# Patient Record
Sex: Male | Born: 1965 | Race: White | Hispanic: No | Marital: Single | Smoking: Current every day smoker
Health system: Southern US, Community
[De-identification: ages and names within clinical notes are randomized; demographics above are authoritative.]

---

## 2015-01-25 ENCOUNTER — Ambulatory Visit: Payer: Medicaid Other | Admitting: Neurology

## 2015-02-04 ENCOUNTER — Ambulatory Visit: Payer: Self-pay | Admitting: Neurology

## 2015-02-04 ENCOUNTER — Telehealth: Payer: Self-pay | Admitting: Neurology

## 2015-02-04 ENCOUNTER — Encounter: Payer: Self-pay | Admitting: Neurology

## 2015-02-04 NOTE — Telephone Encounter (Signed)
This patient did not show for a new patient appointment today. 

## 2015-04-05 ENCOUNTER — Emergency Department (HOSPITAL_COMMUNITY)
Admission: EM | Admit: 2015-04-05 | Discharge: 2015-04-05 | Disposition: A | Discharge status: Home / Self Care | Payer: Self-pay | Attending: Emergency Medicine | Admitting: Emergency Medicine

## 2015-04-05 ENCOUNTER — Encounter (HOSPITAL_COMMUNITY): Payer: Self-pay | Admitting: Emergency Medicine

## 2015-04-05 DIAGNOSIS — K029 Dental caries, unspecified: Secondary | ICD-10-CM | POA: Insufficient documentation

## 2015-04-05 DIAGNOSIS — K047 Periapical abscess without sinus: Secondary | ICD-10-CM | POA: Insufficient documentation

## 2015-04-05 DIAGNOSIS — Z720 Tobacco use: Secondary | ICD-10-CM | POA: Insufficient documentation

## 2015-04-05 MED ORDER — PENICILLIN V POTASSIUM 500 MG PO TABS
500.0000 mg | ORAL_TABLET | Freq: Four times a day (QID) | ORAL | Status: DC
Start: 2015-04-05 — End: 2015-07-01

## 2015-04-05 MED ORDER — HYDROCODONE-ACETAMINOPHEN 5-325 MG PO TABS
1.0000 | ORAL_TABLET | Freq: Once | ORAL | Status: AC
  Administered 2015-04-05: 1 via ORAL
  Filled 2015-04-05: qty 1

## 2015-04-05 MED ORDER — HYDROCODONE-ACETAMINOPHEN 5-325 MG PO TABS: 1.0000 | ORAL_TABLET | Freq: Four times a day (QID) | ORAL | Status: AC | PRN

## 2015-04-05 MED ORDER — IBUPROFEN 800 MG PO TABS: 800.0000 mg | ORAL_TABLET | Freq: Three times a day (TID) | ORAL | Status: AC | PRN

## 2015-04-05 NOTE — ED Provider Notes (Signed)
CSN: [id number]     Arrival date & time 04/05/15  2014 History  This chart was scribed for non-physician practitioner Ebbie Ridgehris Abbie Berling, PA, working with Samuel JesterKathleen McManus, DO, by Tanda RockersMargaux Venter, ED Scribe. This patient was seen in room TR01C/TR01C and the patient's care was started at 9:11 PM.    Chief Complaint  Patient presents with  . Dental Pain   The history is provided by the patient. No language interpreter was used.     HPI Comments: Zachary Cobb is a 49 y.o. male who presents to the Emergency Department complaining of left upper tooth pain that began this morning. Pt reports that filling to left upper tooth fell out approximately 2 months ago. He has not seen a dentist since this occurred. Pt denies fever, chills, or any other symptoms.    History reviewed. No pertinent past medical history. History reviewed. No pertinent past surgical history. No family history on file. History  Substance Use Topics  . Smoking status: Current Every Day Smoker  . Smokeless tobacco: Not on file  . Alcohol Use: Yes    Review of Systems  A complete 10 system review of systems was obtained and all systems are negative except as noted in the HPI and PMH.    Allergies  Review of patient's allergies indicates no known allergies.  Home Medications   Prior to Admission medications   Medication Sig Start Date End Date Taking? Authorizing Provider  acetaminophen (TYLENOL) 500 MG tablet Take 500 mg by mouth every 6 (six) hours as needed for mild pain.   Yes Historical Provider, MD  Naproxen Sodium (ALEVE PO) Take 1 tablet by mouth daily as needed.   Yes Historical Provider, MD   Triage Vitals: BP 137/76 mmHg  Pulse 73  Temp(Src) 98.1 F (36.7 C) (Oral)  Resp 16  Ht 6' (1.829 m)  Wt 182 lb (82.555 kg)  BMI 24.68 kg/m2  SpO2 94%   Physical Exam  Constitutional: He is oriented to person, place, and time. He appears well-developed and well-nourished. No distress.  HENT:  Head:  Normocephalic and atraumatic.  Decaying to left lateral incisor with pain to gumline  Eyes: Conjunctivae and EOM are normal. Pupils are equal, round, and reactive to light.  Neck: Neck supple. No tracheal deviation present.  Cardiovascular: Normal rate, regular rhythm and normal heart sounds.   Pulmonary/Chest: Effort normal and breath sounds normal. No respiratory distress. He has no wheezes. He has no rales.  Musculoskeletal: Normal range of motion.  Neurological: He is alert and oriented to person, place, and time.  Skin: Skin is warm and dry.  Psychiatric: He has a normal mood and affect. His behavior is normal.  Nursing note and vitals reviewed.   ED Course  Procedures (including critical care time)  DIAGNOSTIC STUDIES: Oxygen Saturation is 94% on RA, normal by my interpretation.    COORDINATION OF CARE: 9:12 PM-Discussed treatment plan which includes antibiotic prescription with pt at bedside and pt agreed to plan.  The patient will be referred to dentistry.  Told to return here as needed.  Advised to use heat on the area    [facility] CharlestonChristopher Everrett Lacasse, PA-C 04/05/15 2125  Samuel JesterKathleen McManus, DO 04/07/15 1622

## 2015-04-05 NOTE — Discharge Instructions (Signed)
Return here as needed.  Follow-up with the dentist provided.  Use warm compresses over the area

## 2015-04-05 NOTE — ED Notes (Signed)
C/o dental pain since this morning.  Reports filling on L upper tooth fell out.

## 2015-05-04 ENCOUNTER — Encounter (HOSPITAL_COMMUNITY): Payer: Self-pay | Admitting: Emergency Medicine

## 2015-05-04 DIAGNOSIS — Z720 Tobacco use: Secondary | ICD-10-CM | POA: Insufficient documentation

## 2015-05-04 DIAGNOSIS — N508 Other specified disorders of male genital organs: Secondary | ICD-10-CM | POA: Insufficient documentation

## 2015-05-04 NOTE — ED Notes (Signed)
Patient here with complaint of left testicle pain and swelling accompanied by mild dysuria. Denies injury. Left testicle is much larger that right with some swelling above near pubic bone.

## 2015-05-05 ENCOUNTER — Emergency Department (HOSPITAL_COMMUNITY): Payer: Self-pay

## 2015-05-05 ENCOUNTER — Emergency Department (HOSPITAL_COMMUNITY)
Admission: EM | Admit: 2015-05-05 | Discharge: 2015-05-05 | Discharge status: Against Medical Advice | Payer: Self-pay | Attending: Internal Medicine | Admitting: Internal Medicine

## 2015-05-05 DIAGNOSIS — N50819 Testicular pain, unspecified: Secondary | ICD-10-CM

## 2015-05-05 NOTE — ED Notes (Signed)
No answer when called for the us

## 2015-05-05 NOTE — ED Notes (Signed)
Patient called for US, but didn't answer. At this time patient not present in waiting area.

## 2015-06-30 ENCOUNTER — Inpatient Hospital Stay (HOSPITAL_COMMUNITY): Payer: Self-pay

## 2015-06-30 ENCOUNTER — Encounter (HOSPITAL_COMMUNITY): Payer: Self-pay | Admitting: Emergency Medicine

## 2015-06-30 ENCOUNTER — Inpatient Hospital Stay (HOSPITAL_COMMUNITY)
Admission: EM | Admit: 2015-06-30 | Discharge: 2015-07-01 | DRG: 071 | Disposition: A | Discharge status: Home / Self Care | Payer: Self-pay | Attending: Internal Medicine | Admitting: Internal Medicine

## 2015-06-30 ENCOUNTER — Emergency Department (HOSPITAL_COMMUNITY): Payer: Self-pay

## 2015-06-30 DIAGNOSIS — Z791 Long term (current) use of non-steroidal anti-inflammatories (NSAID): Secondary | ICD-10-CM

## 2015-06-30 DIAGNOSIS — R4182 Altered mental status, unspecified: Secondary | ICD-10-CM

## 2015-06-30 DIAGNOSIS — N179 Acute kidney failure, unspecified: Secondary | ICD-10-CM | POA: Diagnosis present

## 2015-06-30 DIAGNOSIS — R410 Disorientation, unspecified: Secondary | ICD-10-CM | POA: Insufficient documentation

## 2015-06-30 DIAGNOSIS — G9340 Encephalopathy, unspecified: Principal | ICD-10-CM | POA: Diagnosis present

## 2015-06-30 DIAGNOSIS — M549 Dorsalgia, unspecified: Secondary | ICD-10-CM | POA: Diagnosis present

## 2015-06-30 DIAGNOSIS — Z79899 Other long term (current) drug therapy: Secondary | ICD-10-CM

## 2015-06-30 DIAGNOSIS — F17210 Nicotine dependence, cigarettes, uncomplicated: Secondary | ICD-10-CM | POA: Diagnosis present

## 2015-06-30 DIAGNOSIS — F14121 Cocaine abuse with intoxication with delirium: Secondary | ICD-10-CM

## 2015-06-30 DIAGNOSIS — Z79891 Long term (current) use of opiate analgesic: Secondary | ICD-10-CM

## 2015-06-30 DIAGNOSIS — F11121 Opioid abuse with intoxication delirium: Secondary | ICD-10-CM

## 2015-06-30 DIAGNOSIS — D7289 Other specified disorders of white blood cells: Secondary | ICD-10-CM | POA: Diagnosis present

## 2015-06-30 DIAGNOSIS — R740 Nonspecific elevation of levels of transaminase and lactic acid dehydrogenase [LDH]: Secondary | ICD-10-CM | POA: Diagnosis present

## 2015-06-30 DIAGNOSIS — F12121 Cannabis abuse with intoxication delirium: Secondary | ICD-10-CM

## 2015-06-30 DIAGNOSIS — G0490 Encephalitis and encephalomyelitis, unspecified: Secondary | ICD-10-CM | POA: Diagnosis present

## 2015-06-30 DIAGNOSIS — E872 Acidosis: Secondary | ICD-10-CM | POA: Diagnosis present

## 2015-06-30 DIAGNOSIS — F1910 Other psychoactive substance abuse, uncomplicated: Secondary | ICD-10-CM | POA: Diagnosis present

## 2015-06-30 LAB — COMPREHENSIVE METABOLIC PANEL
ALT: 176 U/L — ABNORMAL HIGH (ref 17–63)
AST: 107 U/L — ABNORMAL HIGH (ref 15–41)
Albumin: 4.1 g/dL (ref 3.5–5.0)
Alkaline Phosphatase: 71 U/L (ref 38–126)
Anion gap: 22 — ABNORMAL HIGH (ref 5–15)
BUN: 12 mg/dL (ref 6–20)
CO2: 16 mmol/L — ABNORMAL LOW (ref 22–32)
CREATININE: 1.25 mg/dL — AB (ref 0.61–1.24)
Calcium: 9.8 mg/dL (ref 8.9–10.3)
Chloride: 103 mmol/L (ref 101–111)
GFR calc Af Amer: >60 mL/min (ref >60)
GFR calc non Af Amer: >60 mL/min (ref >60)
GLUCOSE: 144 mg/dL — AB (ref 65–99)
POTASSIUM: 4.3 mmol/L (ref 3.5–5.1)
Sodium: 141 mmol/L (ref 135–145)
TOTAL PROTEIN: 8 g/dL (ref 6.5–8.1)
Total Bilirubin: 1.4 mg/dL — ABNORMAL HIGH (ref 0.3–1.2)

## 2015-06-30 LAB — CBC WITH DIFFERENTIAL/PLATELET
Basophils Absolute: 0 10*3/uL (ref 0.0–0.1)
Basophils Relative: 0 % (ref 0–1)
EOS ABS: 0.1 10*3/uL (ref 0.0–0.7)
Eosinophils Relative: 1 % (ref 0–5)
HEMATOCRIT: 46 % (ref 39.0–52.0)
Hemoglobin: 16.4 g/dL (ref 13.0–17.0)
Lymphocytes Relative: 6 % — ABNORMAL LOW (ref 12–46)
Lymphs Abs: 0.7 10*3/uL (ref 0.7–4.0)
MCH: 32.2 pg (ref 26.0–34.0)
MCHC: 35.7 g/dL (ref 30.0–36.0)
MCV: 90.4 fL (ref 78.0–100.0)
MONO ABS: 0.8 10*3/uL (ref 0.1–1.0)
MONOS PCT: 6 % (ref 3–12)
Neutro Abs: 10.6 10*3/uL — ABNORMAL HIGH (ref 1.7–7.7)
Neutrophils Relative %: 87 % — ABNORMAL HIGH (ref 43–77)
Platelets: 195 10*3/uL (ref 150–400)
RBC: 5.09 MIL/uL (ref 4.22–5.81)
RDW: 13.1 % (ref 11.5–15.5)
WBC: 12.2 10*3/uL — ABNORMAL HIGH (ref 4.0–10.5)

## 2015-06-30 LAB — URINALYSIS, ROUTINE W REFLEX MICROSCOPIC
Glucose, UA: NEGATIVE mg/dL
Hgb urine dipstick: NEGATIVE
KETONES UR: 15 mg/dL — AB
LEUKOCYTES UA: NEGATIVE
NITRITE: NEGATIVE
PROTEIN: 30 mg/dL — AB
Specific Gravity, Urine: 1.031 — ABNORMAL HIGH (ref 1.005–1.030)
Urobilinogen, UA: 0.2 mg/dL (ref 0.0–1.0)
pH: 5.5 (ref 5.0–8.0)

## 2015-06-30 LAB — CREATININE, SERUM
Creatinine, Ser: 1 mg/dL (ref 0.61–1.24)
GFR calc non Af Amer: >60 mL/min (ref >60)

## 2015-06-30 LAB — CBC
HCT: 46.4 % (ref 39.0–52.0)
HEMOGLOBIN: 15.9 g/dL (ref 13.0–17.0)
MCH: 31.3 pg (ref 26.0–34.0)
MCHC: 34.3 g/dL (ref 30.0–36.0)
MCV: 91.3 fL (ref 78.0–100.0)
PLATELETS: 191 10*3/uL (ref 150–400)
RBC: 5.08 MIL/uL (ref 4.22–5.81)
RDW: 13.3 % (ref 11.5–15.5)
WBC: 9.5 10*3/uL (ref 4.0–10.5)

## 2015-06-30 LAB — RAPID URINE DRUG SCREEN, HOSP PERFORMED
AMPHETAMINES: POSITIVE — AB
BARBITURATES: NOT DETECTED
Benzodiazepines: POSITIVE — AB
Cocaine: POSITIVE — AB
Opiates: POSITIVE — AB
TETRAHYDROCANNABINOL: POSITIVE — AB

## 2015-06-30 LAB — SALICYLATE LEVEL: Salicylate Lvl: <4 mg/dL (ref 2.8–30.0)

## 2015-06-30 LAB — LACTIC ACID, PLASMA
Lactic Acid, Venous: 0.8 mmol/L (ref 0.5–2.0)
Lactic Acid, Venous: 0.7 mmol/L (ref 0.5–2.0)

## 2015-06-30 LAB — PROTIME-INR
INR: 1.08 (ref 0.00–1.49)
Prothrombin Time: 14.2 seconds (ref 11.6–15.2)

## 2015-06-30 LAB — MRSA PCR SCREENING: MRSA by PCR: NEGATIVE

## 2015-06-30 LAB — URINE MICROSCOPIC-ADD ON

## 2015-06-30 LAB — APTT: APTT: 31 s (ref 24–37)

## 2015-06-30 LAB — ETHANOL: ALCOHOL ETHYL (B): 5 mg/dL — AB (ref <5)

## 2015-06-30 LAB — MAGNESIUM: Magnesium: 2.6 mg/dL — ABNORMAL HIGH (ref 1.7–2.4)

## 2015-06-30 LAB — ACETAMINOPHEN LEVEL

## 2015-06-30 LAB — PHOSPHORUS: Phosphorus: 5 mg/dL — ABNORMAL HIGH (ref 2.5–4.6)

## 2015-06-30 LAB — PROCALCITONIN: Procalcitonin: 0.36 ng/mL

## 2015-06-30 MED ORDER — LORAZEPAM 2 MG/ML IJ SOLN
INTRAMUSCULAR | Status: AC
  Filled 2015-06-30: qty 1

## 2015-06-30 MED ORDER — LORAZEPAM 2 MG/ML IJ SOLN
2.0000 mg | Freq: Once | INTRAMUSCULAR | Status: AC
  Administered 2015-06-30: 2 mg via INTRAMUSCULAR
  Filled 2015-06-30: qty 1

## 2015-06-30 MED ORDER — GADOBENATE DIMEGLUMINE 529 MG/ML IV SOLN: 18.0000 mL | Freq: Once | INTRAVENOUS | Status: AC | PRN

## 2015-06-30 MED ORDER — LORAZEPAM 2 MG/ML IJ SOLN
1.0000 mg | Freq: Once | INTRAMUSCULAR | Status: DC
  Filled 2015-06-30: qty 1

## 2015-06-30 MED ORDER — ZIPRASIDONE MESYLATE 20 MG IM SOLR
20.0000 mg | Freq: Once | INTRAMUSCULAR | Status: AC
  Administered 2015-06-30: 20 mg via INTRAMUSCULAR
  Filled 2015-06-30: qty 20

## 2015-06-30 MED ORDER — ONDANSETRON HCL 4 MG PO TABS
4.0000 mg | ORAL_TABLET | Freq: Four times a day (QID) | ORAL | Status: DC | PRN
Start: 2015-06-30 — End: 2015-07-01

## 2015-06-30 MED ORDER — SODIUM CHLORIDE 0.9 % IV SOLN
INTRAVENOUS | Status: DC
  Administered 2015-06-30 – 2015-07-01 (×2): via INTRAVENOUS

## 2015-06-30 MED ORDER — DEXTROSE 5 % IV SOLN
800.0000 mg | INTRAVENOUS | Status: AC
  Administered 2015-06-30: 800 mg via INTRAVENOUS
  Filled 2015-06-30: qty 16

## 2015-06-30 MED ORDER — ACYCLOVIR SODIUM 50 MG/ML IV SOLN
800.0000 mg | Freq: Three times a day (TID) | INTRAVENOUS | Status: DC
  Filled 2015-06-30 (×2): qty 16

## 2015-06-30 MED ORDER — DEXTROSE 5 % IV SOLN
800.0000 mg | Freq: Three times a day (TID) | INTRAVENOUS | Status: DC
  Filled 2015-06-30 (×2): qty 16

## 2015-06-30 MED ORDER — ENOXAPARIN SODIUM 40 MG/0.4ML ~~LOC~~ SOLN
40.0000 mg | SUBCUTANEOUS | Status: DC
  Administered 2015-06-30: 40 mg via SUBCUTANEOUS
  Filled 2015-06-30 (×2): qty 0.4

## 2015-06-30 MED ORDER — ONDANSETRON HCL 4 MG/2ML IJ SOLN: 4.0000 mg | Freq: Four times a day (QID) | INTRAMUSCULAR | Status: DC | PRN

## 2015-06-30 MED ORDER — LORAZEPAM 2 MG/ML IJ SOLN
2.0000 mg | INTRAMUSCULAR | Status: DC | PRN
  Administered 2015-06-30 (×2): 2 mg via INTRAVENOUS
  Filled 2015-06-30 (×2): qty 1

## 2015-06-30 MED ORDER — STERILE WATER FOR INJECTION IJ SOLN
INTRAMUSCULAR | Status: AC
  Administered 2015-06-30: 1.2 mL
  Filled 2015-06-30: qty 10

## 2015-06-30 MED ORDER — NICOTINE 21 MG/24HR TD PT24
21.0000 mg | MEDICATED_PATCH | Freq: Every day | TRANSDERMAL | Status: DC
  Administered 2015-06-30 – 2015-07-01 (×2): 21 mg via TRANSDERMAL
  Filled 2015-06-30 (×2): qty 1
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
## 2015-06-30 NOTE — ED Notes (Signed)
Dr Criss Alvine at bedside to perform LP.
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
## 2015-06-30 NOTE — ED Notes (Signed)
Dr. Camillo at bedside. 

## 2015-06-30 NOTE — Progress Notes (Signed)
ANTIBIOTIC CONSULT NOTE - INITIAL  Pharmacy Consult for acyclovir Indication: R/O herpes encephalitis  No Known Allergies  Patient Measurements:   Ideal Body Weight: 78kg  Vital Signs: Temp: 98.1 F (36.7 C) (07/31 1004) Temp Source: Rectal (07/31 1004) BP: 108/67 mmHg (07/31 1000) Pulse Rate: 95 (07/31 1000) Intake/Output from previous day:   Intake/Output from this shift: Total I/O In: -  Out: 425 [Urine:425]  Labs:  Recent Labs  06/30/15 0810  CREATININE 1.25*   CrCl cannot be calculated (Unknown ideal weight.). No results for input(s): VANCOTROUGH, VANCOPEAK, VANCORANDOM, GENTTROUGH, GENTPEAK, GENTRANDOM, TOBRATROUGH, TOBRAPEAK, TOBRARND, AMIKACINPEAK, AMIKACINTROU, AMIKACIN in the last 72 hours.   Microbiology: No results found for this or any previous visit (from the past 720 hour(s)).  Medical History: History reviewed. No pertinent past medical history.  Medications:  Med history pending Assessment: 49 year old man with acute delerium to start on acyclovir for possible herpes encephalitis.  Ideal body weight is 78kg   Plan:  Follow up culture results Acyclovir  IV q8h  Follow renal function  Mickeal Skinner 06/30/2015,10:26 AM

## 2015-06-30 NOTE — Consult Note (Signed)
    [facility]     Reason for Consult: encephalopathy    Referring Physician: Dr. Izola Price    . acyclovir  800 mg Intravenous Q8H  . enoxaparin (LOVENOX) injection  40 mg Subcutaneous Q24H    Recommendations: D/c droplet isolation  d/c acyclovir Drug rehab  Assessment: He has drug induced delirium.  MRI negative for infection.  Patient alert.    Thanks for consult.  Will sign off.   Antibiotics: Acyclovir.   HPI: Zachary Cobb is a 49 y.o. male with history of substance abuse and endorses cocaine, heroin and marijuana use regularly who was found running in traffic by police with erratic behavior.  AMS when he arrived.  Last thing he remembers was yesterday.  Tachy.  Afebrile.  CT initially concern for encephalitis but MRI negative.  No fever, no sick contacts.     Review of Systems: A comprehensive review of systems was negative.  No diarrhea, no dysuria  History reviewed. No pertinent past medical history.   History  Substance Use Topics  . Smoking status: Current Every Day Smoker  . Smokeless tobacco: Not on file  . Alcohol Use: Yes  drug abuse  FMHx: no hx CVA in family  No Known Allergies  OBJECTIVE: Blood pressure 118/74, pulse 69, temperature 98.1 F (36.7 C), temperature source Rectal, resp. rate 19, height  (1.854 m), weight 160 lb 0.9 oz (72.6 kg), SpO2 99 %. General: awake, alert, nad HEENT: anicteric Skin: no rashes Lungs: CTA B Cor: RRR without m Abdomen: soft, nt, nd Exxt: no edema Neuro: moves all extremities, oriented to place and time  Microbiology: No results found for this or any previous visit (from the past 240 hour(s)).  Staci Righter, MD [facility] [email or web address] [phone] pager  [phone] cell 06/30/2015, 3:25 PM

## 2015-06-30 NOTE — ED Notes (Addendum)
Placed pt on 2 L nasal cannula.  Pt sleeping due to medications.  Pt diaphoresis has slowed but not stopped.
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
## 2015-06-30 NOTE — ED Notes (Signed)
Dr Criss Alvine unable to obtain CSF cultures.
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
## 2015-06-30 NOTE — ED Notes (Signed)
On arrival to the IP unit, pt woke up.  Alert to location and year, pt reported confusion.  Pt admitted to taking several things; reported one was heroine and looked down at his left arm.
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
## 2015-07-01 DIAGNOSIS — G0490 Encephalitis and encephalomyelitis, unspecified: Secondary | ICD-10-CM

## 2015-07-01 DIAGNOSIS — R740 Nonspecific elevation of levels of transaminase and lactic acid dehydrogenase [LDH]: Secondary | ICD-10-CM

## 2015-07-01 DIAGNOSIS — N179 Acute kidney failure, unspecified: Secondary | ICD-10-CM

## 2015-07-01 DIAGNOSIS — F1910 Other psychoactive substance abuse, uncomplicated: Secondary | ICD-10-CM

## 2015-07-01 DIAGNOSIS — R410 Disorientation, unspecified: Secondary | ICD-10-CM

## 2015-07-01 LAB — COMPREHENSIVE METABOLIC PANEL
ALBUMIN: 3.3 g/dL — AB (ref 3.5–5.0)
ALT: 122 U/L — AB (ref 17–63)
AST: 62 U/L — AB (ref 15–41)
Alkaline Phosphatase: 52 U/L (ref 38–126)
Anion gap: 5 (ref 5–15)
BUN: 14 mg/dL (ref 6–20)
CALCIUM: 8.7 mg/dL — AB (ref 8.9–10.3)
CO2: 28 mmol/L (ref 22–32)
Chloride: 108 mmol/L (ref 101–111)
Creatinine, Ser: 0.96 mg/dL (ref 0.61–1.24)
GFR calc Af Amer: >60 mL/min (ref >60)
GFR calc non Af Amer: >60 mL/min (ref >60)
Glucose, Bld: 105 mg/dL — ABNORMAL HIGH (ref 65–99)
Potassium: 3.4 mmol/L — ABNORMAL LOW (ref 3.5–5.1)
Sodium: 141 mmol/L (ref 135–145)
TOTAL PROTEIN: 6.4 g/dL — AB (ref 6.5–8.1)
Total Bilirubin: 0.3 mg/dL (ref 0.3–1.2)

## 2015-07-01 LAB — CBC
HCT: 41.5 % (ref 39.0–52.0)
HEMOGLOBIN: 14.2 g/dL (ref 13.0–17.0)
MCH: 31.5 pg (ref 26.0–34.0)
MCHC: 34.2 g/dL (ref 30.0–36.0)
MCV: 92 fL (ref 78.0–100.0)
PLATELETS: 185 10*3/uL (ref 150–400)
RBC: 4.51 MIL/uL (ref 4.22–5.81)
RDW: 13.5 % (ref 11.5–15.5)
WBC: 7.8 10*3/uL (ref 4.0–10.5)

## 2015-07-01 LAB — HEPATITIS PANEL, ACUTE
HCV Ab: >11 s/co ratio — ABNORMAL HIGH (ref 0.0–0.9)
Hep A IgM: NEGATIVE
Hep B C IgM: NEGATIVE
Hepatitis B Surface Ag: NEGATIVE

## 2015-07-01 LAB — HIV ANTIBODY (ROUTINE TESTING W REFLEX): HIV Screen 4th Generation wRfx: NONREACTIVE

## 2015-07-01 LAB — RPR: RPR Ser Ql: NONREACTIVE

## 2015-07-01 MED ORDER — NICOTINE 21 MG/24HR TD PT24: 21.0000 mg | MEDICATED_PATCH | Freq: Every day | TRANSDERMAL | Status: AC

## 2015-07-01 NOTE — Progress Notes (Signed)
Pt refusing PRN ativan for CIWAH score. Pt states he does not want anything that might make him sleepy, states he want to keep a clear mind. States he does not feel like he needs to be in the hospital and wants to go home. Calling various phone numbers looking for a ride to get home, states he will let me know later if he decides to stay or go

## 2015-07-01 NOTE — Progress Notes (Signed)
   07/01/15 1000  Clinical Encounter Type  Visited With Patient;Other (Comment) (girlfriend)  Visit Type Follow-up;Spiritual support;Social support  Referral From Nurse  Consult/Referral To Chaplain  Spiritual Encounters  Spiritual Needs Prayer;Emotional  Stress Factors  Patient Stress Factors Other (Comment) (drug use history)  Ch met with pt on referral; offered prayer; talked about near death drug overdose; pt waiting discharge; girlfriend in room clearly showing signs of being on some type of stimulant with jerky bodily movement and moderate lucidity; pt demeanor non-reactionary and calm.

## 2015-07-01 NOTE — Progress Notes (Signed)
Subjective: Resting comfortably. Feels mental status has improved. Main concern this morning is back pain.   Objective: Current vital signs: BP 111/74 mmHg  Pulse 78  Temp(Src) 98 F (36.7 C) (Oral)  Resp 13  Ht 6\' 1"  (1.854 m)  Wt 72.6 kg (160 lb 0.9 oz)  BMI 21.12 kg/m2  SpO2 97% Vital signs in last 24 hours: Temp:  [97.3 F (36.3 C)-98.8 F (37.1 C)] 98 F (36.7 C) (08/01 0335) Pulse Rate:  [69-160] 78 (08/01 0727) Resp:  [13-28] 13 (08/01 0727) BP: (86-139)/(51-80) 111/74 mmHg (08/01 0727) SpO2:  [89 %-100 %] 97 % (08/01 0727) Weight:  [72.6 kg (160 lb 0.9 oz)] 72.6 kg (160 lb 0.9 oz) (07/31 1307)  Intake/Output from previous day: 07/31 0701 - 08/01 0700 In: 1340 [P.O.:640; I.V.:700] Out: 425 [Urine:425] Intake/Output this shift:   Nutritional status: Diet regular Room service appropriate?: Yes; Fluid consistency:: Thin  Neurologic Exam: Mental status: alert, responsive, oriented x 3. Follows simple commands. No dysarthria, no aphasia noted. CN 2-12: pupils 2-3 mm, reactive. No gaze preference. Face symmetric.  Motor: moves all limbs symmetrically. Sensory: Light touch intact in all extremities DTR's: 1+ all over. Plantars: down.  Lab Results: Basic Metabolic Panel:  Recent Labs Lab 06/30/15 0810 06/30/15 1450 07/01/15 0244  NA 141  --  141  K 4.3  --  3.4*  CL 103  --  108  CO2 16*  --  28  GLUCOSE 144*  --  105*  BUN 12  --  14  CREATININE 1.25* 1.00 0.96  CALCIUM 9.8  --  8.7*  MG  --  2.6*  --   PHOS  --  5.0*  --     Liver Function Tests:  Recent Labs Lab 06/30/15 0810 07/01/15 0244  AST 107* 62*  ALT 176* 122*  ALKPHOS 71 52  BILITOT 1.4* 0.3  PROT 8.0 6.4*  ALBUMIN 4.1 3.3*   No results for input(s): LIPASE, AMYLASE in the last 168 hours. No results for input(s): AMMONIA in the last 168 hours.  CBC:  Recent Labs Lab 06/30/15 0944 06/30/15 1450 07/01/15 0244  WBC 12.2* 9.5 7.8  NEUTROABS 10.6*  --   --   HGB 16.4 15.9  14.2  HCT 46.0 46.4 41.5  MCV 90.4 91.3 92.0  PLT 195 191 185    Cardiac Enzymes: No results for input(s): CKTOTAL, CKMB, CKMBINDEX, TROPONINI in the last 168 hours.  Lipid Panel: No results for input(s): CHOL, TRIG, HDL, CHOLHDL, VLDL, LDLCALC in the last 168 hours.  CBG: No results for input(s): GLUCAP in the last 168 hours.  Microbiology: Results for orders placed or performed during the hospital encounter of 06/30/15  MRSA PCR Screening     Status: None   Collection Time: 06/30/15  2:03 PM  Result Value Ref Range Status   MRSA by PCR NEGATIVE NEGATIVE Final    Comment:        The GeneXpert MRSA Assay (FDA approved for NASAL specimens only), is one component of a comprehensive MRSA colonization surveillance program. It is not intended to diagnose MRSA infection nor to guide or monitor treatment for MRSA infections.     Coagulation Studies:  Recent Labs  06/30/15 1450  LABPROT 14.2  INR 1.08    Imaging: Ct Head Wo Contrast  06/30/2015   CLINICAL DATA:  Altered mental status. Found running in traffic by police.  EXAM: CT HEAD WITHOUT CONTRAST  TECHNIQUE: Contiguous axial images were obtained from the base  of the skull through the vertex without intravenous contrast.  COMPARISON:  None.  FINDINGS: There is blurring of the gray-white junction throughout the left temporal lobe (representative images 10 through 12, series 2). This finding is without associated mass effect. No definitive intraparenchymal or extra-axial mass or hemorrhage. Normal size and configuration of the ventricles and basilar cisterns. No midline shift. Limited visualization of the paranasal sinuses and mastoid air cells is normal. No air-fluid levels. Regional soft tissues appear normal. No displaced calvarial fracture.  IMPRESSION: Blurring of the gray-white junction throughout the left temporal lobe, potentially artifactual secondary to streak from the adjacent calvarium though could be seen in the  setting herpes encephalitis (favored) versus evolving infarct. Further evaluation with brain MRI could be performed as clinically indicated.   Electronically Signed   By: Simonne Come M.D.   On: 06/30/2015 09:20   Mr Laqueta Jean ZO Contrast  06/30/2015   CLINICAL DATA:  Altered mental status. Erratic behavior. Abnormal CT of the head.  EXAM: MRI HEAD WITHOUT AND WITH CONTRAST  TECHNIQUE: Multiplanar, multiecho pulse sequences of the brain and surrounding structures were obtained without and with intravenous contrast.  CONTRAST:  18 mL MultiHance  COMPARISON:  None.  FINDINGS: The temporal lobes are symmetric and normal in signal. There is no edema or enhancement to suggest encephalitis or infection.  No acute infarct, hemorrhage, or mass lesion is present. Mild periventricular T2 changes are slightly advanced for age. The ventricles are of normal size. No significant extraaxial fluid collection is present.  Flow is present in the major intracranial arteries. The globes orbits are intact. Mild mucosal thickening is noted in the anterior ethmoid air cells bilaterally. The remaining paranasal sinuses are clear. There is minimal fluid in the mastoid air cells. No obstructing nasopharyngeal lesion is evident.  The postcontrast images demonstrate no pathologic enhancement.  Skullbase is within normal limits. Midline structures are unremarkable.  IMPRESSION: 1. Normal MRI appearance of the brain. No evidence for infection or encephalitis. 2. CT findings were likely artifactual, which could not be determined on the basis of the CT.   Electronically Signed   By: Marin Roberts M.D.   On: 06/30/2015 12:54   Dg Chest Port 1 View  06/30/2015   CLINICAL DATA:  Altered mental status.  EXAM: PORTABLE CHEST - 1 VIEW  COMPARISON:  None.  FINDINGS: The cardiomediastinal silhouette is unremarkable.  Minimal right basilar atelectasis noted.  There is no evidence of focal airspace disease, pulmonary edema, suspicious pulmonary  nodule/mass, pleural effusion, or pneumothorax. No acute bony abnormalities are identified.  IMPRESSION: Minimal right basilar atelectasis.   Electronically Signed   By: Harmon Pier M.D.   On: 06/30/2015 11:53    Medications:  Scheduled: . enoxaparin (LOVENOX) injection  40 mg Subcutaneous Q24H  . nicotine  21 mg Transdermal Daily    Assessment/Plan:  49 y/o presenting with hyperactive delirium likely drug induced in the setting of UDS positive for cocaine, amphetamines, THC, benzo and opiates. Mental status markedly improved this morning. Afebrile with no leukocytosis.  -no indication for EEG or further neurological workup at this time -will follow up as needed   LOS: 1 day   Elspeth Cho, DO [facility] [phone]  If 7pm- 7am, please page neurology on call as listed in AMION. 07/01/2015  7:55 AM

## 2015-07-01 NOTE — Care Management Note (Signed)
Case Management Note  Patient Details  Name: [name] MRN: [id number] Date of Birth: [birth year]  Subjective/Objective:                 Admitted with acute encephalopathy / sepsis   Action/Plan:  Return to home when medically stable. CM to f/u with d/c needs. Expected Discharge Date:                  Expected Discharge Plan:  Home/Self Care  In-House Referral:  Clinical Social Work (pt with drug abuse hx)  Discharge planning Services  CM Consult  Post Acute Care Choice:    Choice offered to:     DME Arranged:    DME Agency:     HH Arranged:    HH Agency:     Status of Service:  In process, will continue to follow  Medicare Important Message Given:    Date Medicare IM Given:    Medicare IM give by:    Date Additional Medicare IM Given:    Additional Medicare Important Message give by:     If discussed at Long Length of Stay Meetings, dates discussed:    Additional Comments: [place]  [phone]  Epifanio Lesches, RN 07/01/2015, 10:39 AM
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
## 2015-07-01 NOTE — Progress Notes (Signed)
Discharge instructions given to patient all questions answered at this time.  Pt. VSS with no s/s of distress noted.  Pt. Stable at discharge.  Cab voucher given to patient.

## 2015-07-01 NOTE — Discharge Instructions (Signed)
Altered Mental Status °Altered mental status most often refers to an abnormal change in your responsiveness and awareness. It can affect your speech, thought, mobility, memory, attention span, or alertness. It can range from slight confusion to complete unresponsiveness (coma). Altered mental status can be a sign of a serious underlying medical condition. Rapid evaluation and medical treatment is necessary for patients having an altered mental status. °CAUSES  °· Low blood sugar (hypoglycemia) or diabetes. °· Severe loss of body fluids (dehydration) or a body salt (electrolyte) imbalance. °· A stroke or other neurologic problem, such as dementia or delirium. °· A head injury or tumor. °· A drug or alcohol overdose. °· Exposure to toxins or poisons. °· Depression, anxiety, and stress. °· A low oxygen level (hypoxia). °· An infection. °· Blood loss. °· Twitching or shaking (seizure). °· Heart problems, such as heart attack or heart rhythm problems (arrhythmias). °· A body temperature that is too low or too high (hypothermia or hyperthermia). °DIAGNOSIS  °A diagnosis is based on your history, symptoms, physical and neurologic examinations, and diagnostic tests. Diagnostic tests may include: °· Measurement of your blood pressure, pulse, breathing, and oxygen levels (vital signs). °· Blood tests. °· Urine tests. °· X-ray exams. °· A computerized magnetic scan (magnetic resonance imaging, MRI). °· A computerized X-ray scan (computed tomography, CT scan). °TREATMENT  °Treatment will depend on the cause. Treatment may include: °· Management of an underlying medical or mental health condition. °· Critical care or support in the hospital. °HOME CARE INSTRUCTIONS  °· Only take over-the-counter or prescription medicines for pain, discomfort, or fever as directed by your caregiver. °· Manage underlying conditions as directed by your caregiver. °· Eat a healthy, well-balanced diet to maintain strength. °· Join a support group or  prevention program to cope with the condition or trauma that caused the altered mental status. Ask your caregiver to help choose a program that works for you. °· Follow up with your caregiver for further examination, therapy, or testing as directed. °SEEK MEDICAL CARE IF:  °· You feel unwell or have chills. °· You or your family notice a change in your behavior or your alertness. °· You have trouble following your caregiver's treatment plan. °· You have questions or concerns. °SEEK IMMEDIATE MEDICAL CARE IF:  °· You have a rapid heartbeat or have chest pain. °· You have difficulty breathing. °· You have a fever. °· You have a headache with a stiff neck. °· You cough up blood. °· You have blood in your urine or stool. °· You have severe agitation or confusion. °MAKE SURE YOU:  °· Understand these instructions. °· Will watch your condition. °· Will get help right away if you are not doing well or get worse. °Document Released: 05/06/2010 Document Revised: 02/08/2012 Document Reviewed: 05/06/2010 °ExitCare® Patient Information ©2015 [employer]. This information is not intended to replace advice given to you by your health care provider. Make sure you discuss any questions you have with your health care provider. ° ° °Polysubstance Abuse °When people abuse more than one drug or type of drug it is called polysubstance or polydrug abuse. For example, many smokers also drink alcohol. This is one form of polydrug abuse. Polydrug abuse also refers to the use of a drug to counteract an unpleasant effect produced by another drug. It may also be used to help with withdrawal from another drug. People who take stimulants may become agitated. Sometimes this agitation is countered with a tranquilizer. This helps protect against the   unpleasant side effects. Polydrug abuse also refers to the use of different drugs at the same time.  °Anytime drug use is interfering with normal living activities, it has become abuse. This includes  problems with family and friends. Psychological dependence has developed when your mind tells you that the drug is needed. This is usually followed by physical dependence which has developed when continuing increases of drug are required to get the same feeling or "high". This is known as addiction or chemical dependency. A person's risk is much higher if there is a history of chemical dependency in the family. °SIGNS OF CHEMICAL DEPENDENCY °· You have been told by friends or family that drugs have become a problem. °· You fight when using drugs. °· You are having blackouts (not remembering what you do while using). °· You feel sick from using drugs but continue using. °· You lie about use or amounts of drugs (chemicals) used. °· You need chemicals to get you going. °· You are suffering in work performance or in school because of drug use. °· You get sick from use of drugs but continue to use anyway. °· You need drugs to relate to people or feel comfortable in social situations. °· You use drugs to forget problems. °"Yes" answered to any of the above signs of chemical dependency indicates there are problems. The longer the use of drugs continues, the greater the problems will become. °If there is a family history of drug or alcohol use, it is best not to experiment with these drugs. Continual use leads to tolerance. After tolerance develops more of the drug is needed to get the same feeling. This is followed by addiction. With addiction, drugs become the most important part of life. It becomes more important to take drugs than participate in the other usual activities of life. This includes relating to friends and family. Addiction is followed by dependency. Dependency is a condition where drugs are now needed not just to get high, but to feel normal. °Addiction cannot be cured but it can be stopped. This often requires outside help and the care of professionals. Treatment centers are listed in the yellow pages  under: Cocaine, Narcotics, and Alcoholics Anonymous. Most hospitals and clinics can refer you to a specialized care center. Talk to your caregiver if you need help. °Document Released: 07/08/2005 Document Revised: 02/08/2012 Document Reviewed: 11/16/2005 °ExitCare® Patient Information ©2015 [employer]. This information is not intended to replace advice given to you by your health care provider. Make sure you discuss any questions you have with your health care provider. ° °

## 2015-07-01 NOTE — Progress Notes (Signed)
Patient refusing ativan patient is requesting methadone instead.  Physician notified of request received no new orders at this time.  Will continue to monitor.

## 2015-07-05 LAB — CULTURE, BLOOD (ROUTINE X 2)
CULTURE: NO GROWTH
Culture: NO GROWTH
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
## 2017-04-06 IMAGING — MR MR HEAD WO/W CM
10 of 13 series · 34 of 48 positions shown · IV contrast (18    MULTIHANCE)
Comparison: None.

CLINICAL DATA: Altered mental status. Erratic behavior. Abnormal CT
of the head.

EXAM:
MRI HEAD WITHOUT AND WITH CONTRAST
TECHNIQUE: Multiplanar, multiecho pulse sequences of the brain and surrounding
structures were obtained without and with intravenous contrast.
CONTRAST:  18 mL MultiHance

[Series 3: DWI · axial · 3.0mm · 1.09mm/px · z∈[-28,+128]mm · 9 of 106 slices shown (1 of 4)]
[im 1/106]
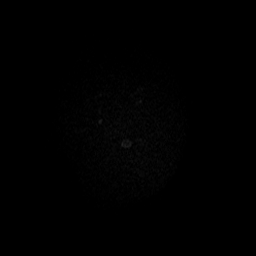
[im 14/106]
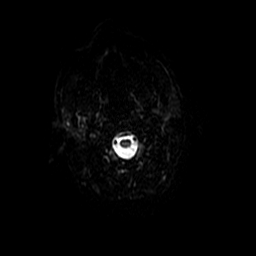
[im 27/106]
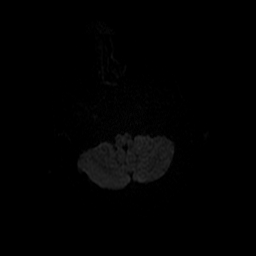
[im 40/106]
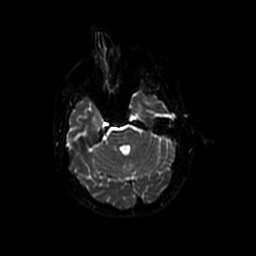
[im 53/106]
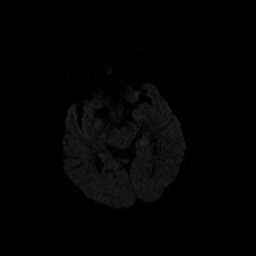
[im 66/106]
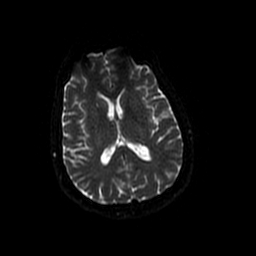
[im 79/106]
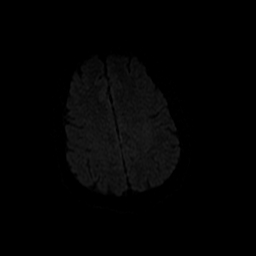
[im 92/106]
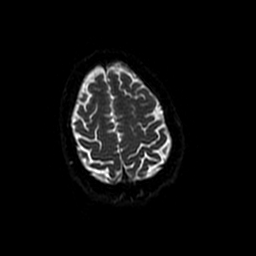
[im 106/106]
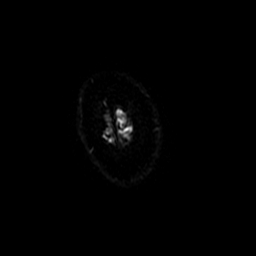

[Series 4: DWI · coronal · 5.0mm · 1.09mm/px · 6 of 78 slices shown (2 of 4)]
[im 1/78]
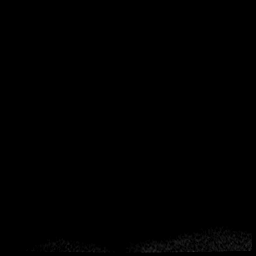
[im 16/78]
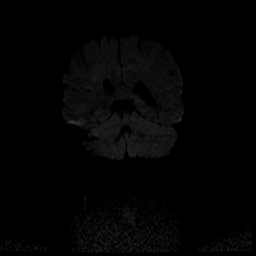
[im 31/78]
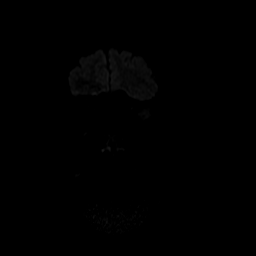
[im 47/78]
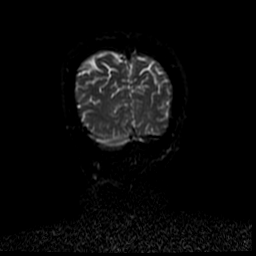
[im 62/78]
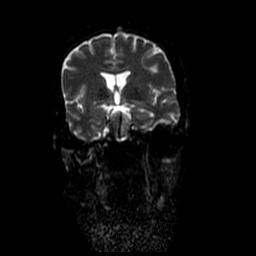
[im 78/78]
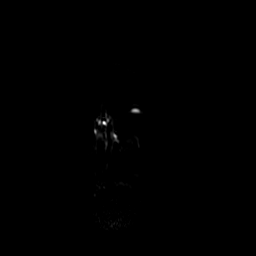

[Series 5: T1 · sagittal · 5.0mm · 0.47mm/px · 2 of 23 slices shown]
[im 1/23]
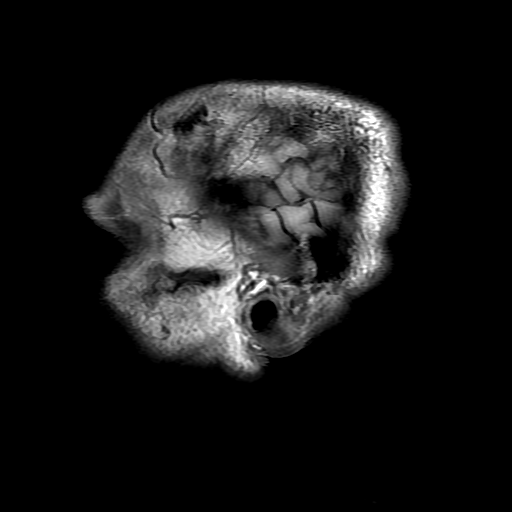
[im 23/23]
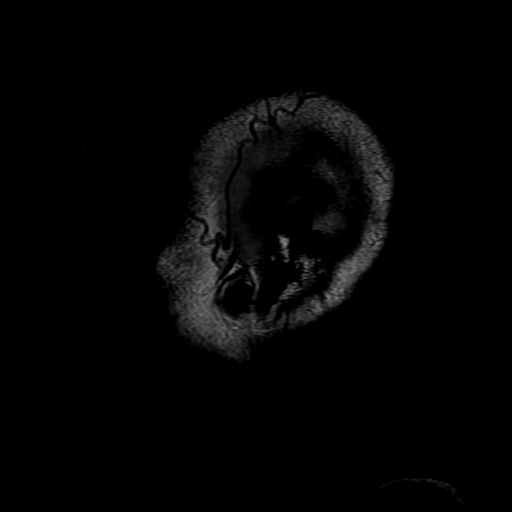

[Series 6: T2 · axial · 5.0mm · 0.43mm/px · z∈[-38,+130]mm · 2 of 29 slices shown]
[im 1/29]
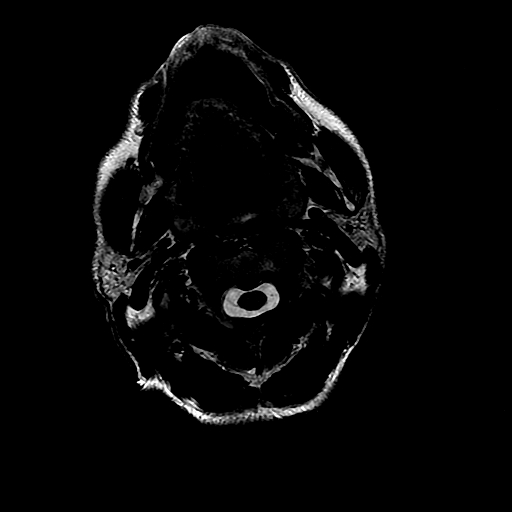
[im 29/29]
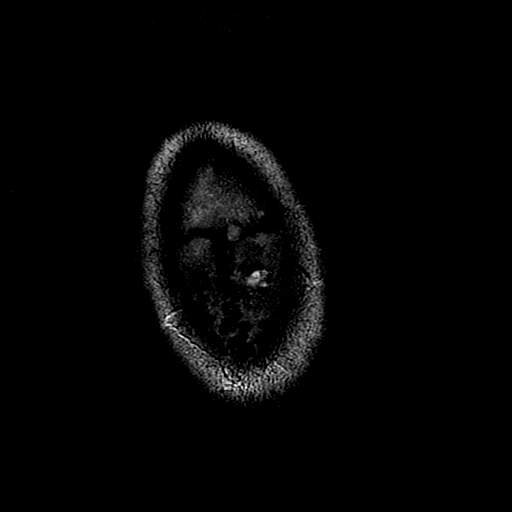

[Series 7: FLAIR · axial · 5.0mm · 0.43mm/px · z∈[-37,+125]mm · 2 of 28 slices shown]
[im 1/28]
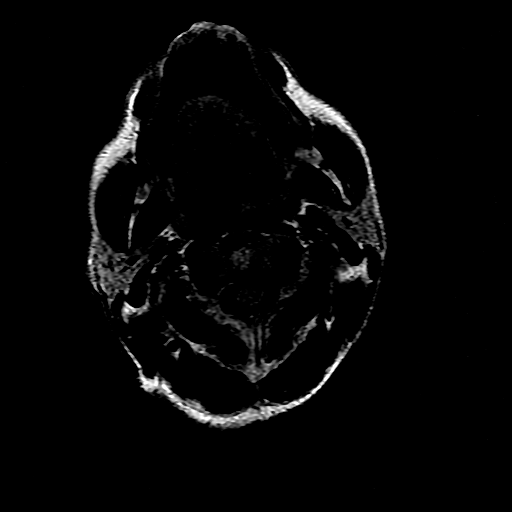
[im 28/28]
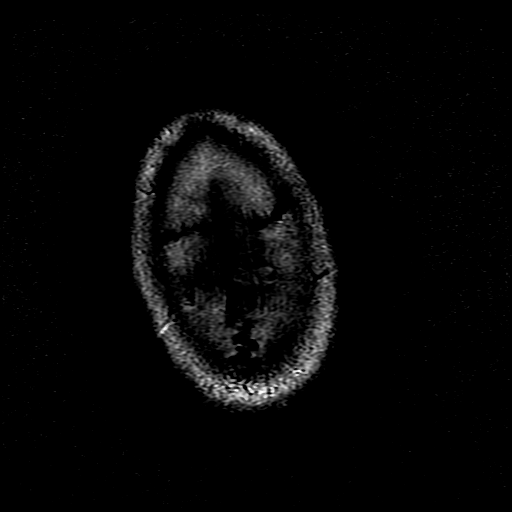

[Series 10: T2 post-contrast · coronal · 5.0mm · 0.39mm/px · 2 of 30 slices shown]
[im 1/30]
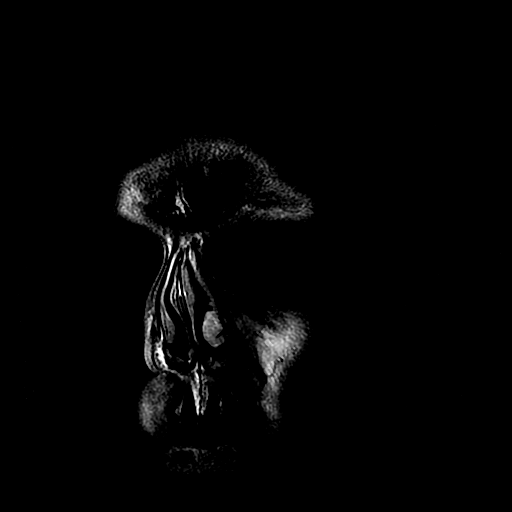
[im 30/30]
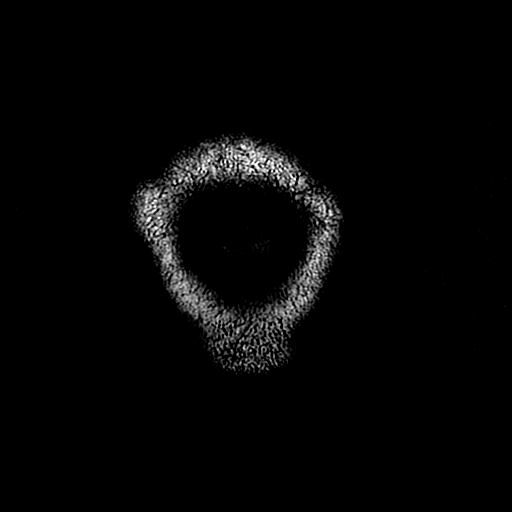

[Series 11: T1 post-contrast · sagittal · 5.0mm · 0.47mm/px · 2 of 23 slices shown (1 of 2)]
[im 1/23]
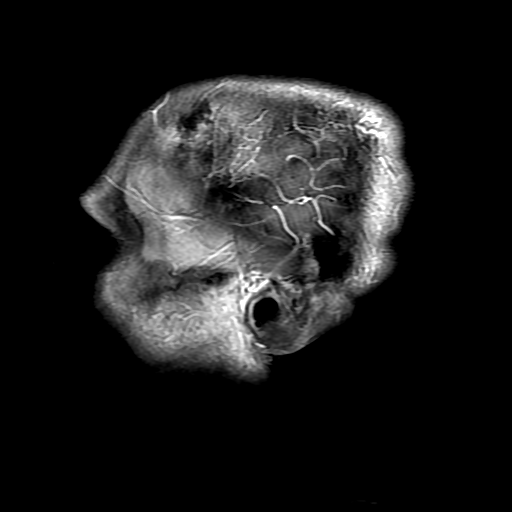
[im 23/23]
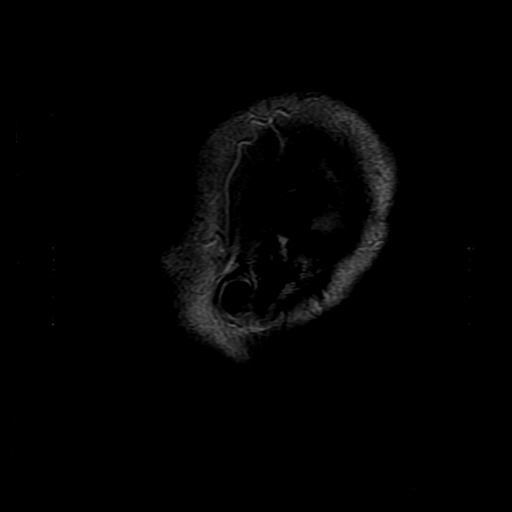

[Series 13: T1 post-contrast · coronal · 5.0mm · 0.39mm/px · 2 of 24 slices shown (2 of 2)]
[im 1/24]
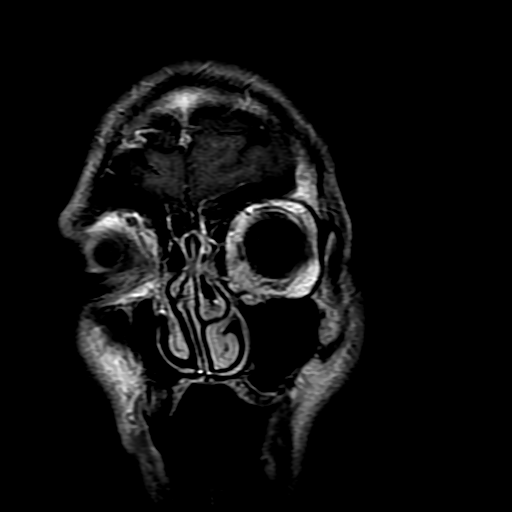
[im 24/24]
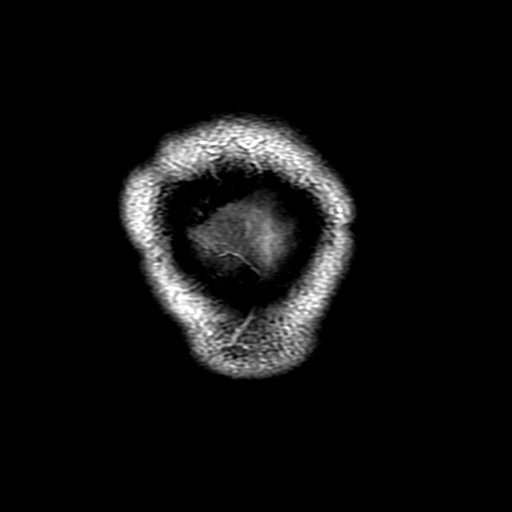

[Series 300: DWI · axial · 3.0mm · 1.09mm/px · z∈[-28,+128]mm · 4 of 53 slices shown (3 of 4)]
[im 1/53]
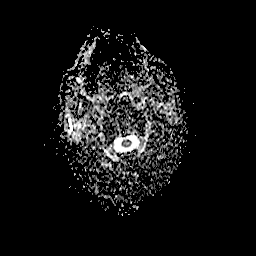
[im 18/53]
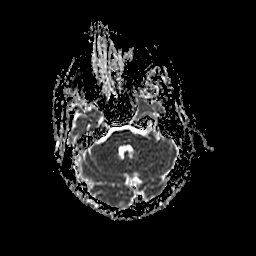
[im 35/53]
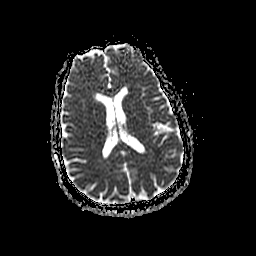
[im 53/53]
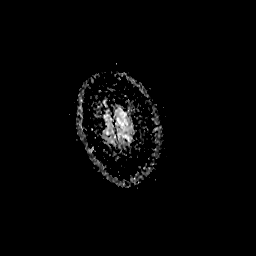

[Series 400: DWI · coronal · 5.0mm · 1.09mm/px · 3 of 39 slices shown (4 of 4)]
[im 1/39]
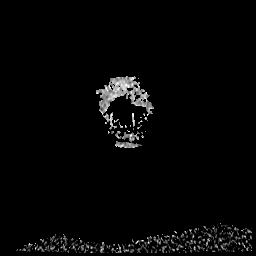
[im 20/39]
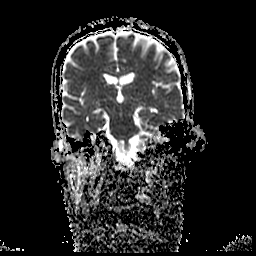
[im 39/39]
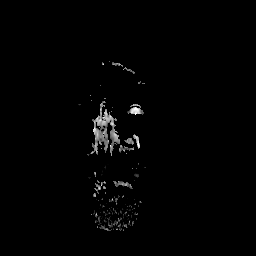

[34 of 48 positions shown; findings below may reference images not displayed]

FINDINGS: The temporal lobes are symmetric and normal in signal. There is no
edema or enhancement to suggest encephalitis or infection.

No acute infarct, hemorrhage, or mass lesion is present. Mild
periventricular T2 changes are slightly advanced for age. The
ventricles are of normal size. No significant extraaxial fluid
collection is present.

Flow is present in the major intracranial arteries. The globes
orbits are intact. Mild mucosal thickening is noted in the anterior
ethmoid air cells bilaterally. The remaining paranasal sinuses are
clear. There is minimal fluid in the mastoid air cells. No
obstructing nasopharyngeal lesion is evident.

The postcontrast images demonstrate no pathologic enhancement.

Skullbase is within normal limits. Midline structures are
unremarkable.
IMPRESSION: 1. Normal MRI appearance of the brain. No evidence for infection or
encephalitis.
2. CT findings were likely artifactual, which could not be
determined on the basis of the CT.
